# Patient Record
Sex: Male | Born: 1965 | Race: White | Hispanic: No | Marital: Married
Health system: Southern US, Community
[De-identification: ages and names within clinical notes are randomized; demographics above are authoritative.]

---

## 2011-05-30 ENCOUNTER — Observation Stay: Payer: Self-pay | Admitting: Surgery

## 2011-06-04 ENCOUNTER — Ambulatory Visit: Payer: Self-pay | Admitting: Unknown Physician Specialty

## 2011-08-30 ENCOUNTER — Ambulatory Visit: Payer: Self-pay | Admitting: Unknown Physician Specialty

## 2014-08-07 LAB — CBC WITH DIFFERENTIAL/PLATELET
Basophil #: 0 10*3/uL (ref 0.0–0.1)
Basophil %: 0.2 %
EOS PCT: 3.3 %
Eosinophil #: 0.4 10*3/uL (ref 0.0–0.7)
HCT: 32.8 % — ABNORMAL LOW (ref 40.0–52.0)
HGB: 10.7 g/dL — AB (ref 13.0–18.0)
LYMPHS ABS: 1.6 10*3/uL (ref 1.0–3.6)
Lymphocyte %: 12.7 %
MCH: 29.5 pg (ref 26.0–34.0)
MCHC: 32.5 g/dL (ref 32.0–36.0)
MCV: 91 fL (ref 80–100)
Monocyte #: 1.2 x10 3/mm — ABNORMAL HIGH (ref 0.2–1.0)
Monocyte %: 9.6 %
NEUTROS ABS: 9.4 10*3/uL — AB (ref 1.4–6.5)
Neutrophil %: 74.2 %
Platelet: 391 10*3/uL (ref 150–440)
RBC: 3.61 10*6/uL — ABNORMAL LOW (ref 4.40–5.90)
RDW: 13.5 % (ref 11.5–14.5)
WBC: 12.7 10*3/uL — ABNORMAL HIGH (ref 3.8–10.6)

## 2014-08-07 LAB — COMPREHENSIVE METABOLIC PANEL
ALK PHOS: 69 U/L
ALT: 22 U/L
ANION GAP: 7 (ref 7–16)
AST: 22 U/L (ref 15–37)
Albumin: 2.4 g/dL — ABNORMAL LOW (ref 3.4–5.0)
BILIRUBIN TOTAL: 0.3 mg/dL (ref 0.2–1.0)
BUN: 8 mg/dL (ref 7–18)
CALCIUM: 8 mg/dL — AB (ref 8.5–10.1)
CO2: 28 mmol/L (ref 21–32)
Chloride: 103 mmol/L (ref 98–107)
Creatinine: 1.05 mg/dL (ref 0.60–1.30)
EGFR (African American): >60
EGFR (Non-African Amer.): >60
Glucose: 89 mg/dL (ref 65–99)
OSMOLALITY: 273 (ref 275–301)
POTASSIUM: 3.9 mmol/L (ref 3.5–5.1)
Sodium: 138 mmol/L (ref 136–145)
Total Protein: 6.4 g/dL (ref 6.4–8.2)

## 2014-08-07 LAB — TROPONIN I: Troponin-I: <0.02 ng/mL

## 2014-08-07 LAB — URINALYSIS, COMPLETE
BLOOD: NEGATIVE
Bacteria: NONE SEEN
Bilirubin,UR: NEGATIVE
Glucose,UR: NEGATIVE mg/dL (ref 0–75)
Leukocyte Esterase: NEGATIVE
Nitrite: NEGATIVE
Ph: 5 (ref 4.5–8.0)
RBC,UR: 3 /HPF (ref 0–5)
SPECIFIC GRAVITY: 1.03 (ref 1.003–1.030)
WBC UR: 4 /HPF (ref 0–5)

## 2014-08-07 LAB — LIPASE, BLOOD: Lipase: 987 U/L — ABNORMAL HIGH (ref 73–393)

## 2014-08-08 ENCOUNTER — Inpatient Hospital Stay: Payer: Self-pay | Admitting: Internal Medicine

## 2014-08-09 LAB — BASIC METABOLIC PANEL
ANION GAP: 3 — AB (ref 7–16)
BUN: 8 mg/dL (ref 7–18)
CALCIUM: 7.5 mg/dL — AB (ref 8.5–10.1)
CO2: 30 mmol/L (ref 21–32)
CREATININE: 0.88 mg/dL (ref 0.60–1.30)
Chloride: 109 mmol/L — ABNORMAL HIGH (ref 98–107)
EGFR (African American): >60
EGFR (Non-African Amer.): >60
Glucose: 157 mg/dL — ABNORMAL HIGH (ref 65–99)
Osmolality: 285 (ref 275–301)
POTASSIUM: 4 mmol/L (ref 3.5–5.1)
Sodium: 142 mmol/L (ref 136–145)

## 2014-08-09 LAB — CBC WITH DIFFERENTIAL/PLATELET
Basophil #: 0 10*3/uL (ref 0.0–0.1)
Basophil %: 0.2 %
EOS PCT: 0.1 %
Eosinophil #: 0 10*3/uL (ref 0.0–0.7)
HCT: 28.3 % — AB (ref 40.0–52.0)
HGB: 9.2 g/dL — ABNORMAL LOW (ref 13.0–18.0)
LYMPHS ABS: 0.6 10*3/uL — AB (ref 1.0–3.6)
LYMPHS PCT: 9.1 %
MCH: 29.6 pg (ref 26.0–34.0)
MCHC: 32.6 g/dL (ref 32.0–36.0)
MCV: 91 fL (ref 80–100)
MONO ABS: 0.4 x10 3/mm (ref 0.2–1.0)
Monocyte %: 6.9 %
NEUTROS ABS: 5.3 10*3/uL (ref 1.4–6.5)
NEUTROS PCT: 83.7 %
Platelet: 350 10*3/uL (ref 150–440)
RBC: 3.11 10*6/uL — ABNORMAL LOW (ref 4.40–5.90)
RDW: 13.4 % (ref 11.5–14.5)
WBC: 6.4 10*3/uL (ref 3.8–10.6)

## 2014-08-10 LAB — LIPASE, BLOOD: LIPASE: 194 U/L (ref 73–393)

## 2014-08-10 LAB — CBC WITH DIFFERENTIAL/PLATELET
Basophil #: 0 10*3/uL (ref 0.0–0.1)
Basophil %: 0.2 %
EOS PCT: 0.1 %
Eosinophil #: 0 10*3/uL (ref 0.0–0.7)
HCT: 25.9 % — ABNORMAL LOW (ref 40.0–52.0)
HGB: 8.5 g/dL — ABNORMAL LOW (ref 13.0–18.0)
LYMPHS PCT: 10.6 %
Lymphocyte #: 0.9 10*3/uL — ABNORMAL LOW (ref 1.0–3.6)
MCH: 29.6 pg (ref 26.0–34.0)
MCHC: 32.9 g/dL (ref 32.0–36.0)
MCV: 90 fL (ref 80–100)
MONOS PCT: 11.8 %
Monocyte #: 1.1 x10 3/mm — ABNORMAL HIGH (ref 0.2–1.0)
NEUTROS ABS: 6.9 10*3/uL — AB (ref 1.4–6.5)
NEUTROS PCT: 77.3 %
Platelet: 403 10*3/uL (ref 150–440)
RBC: 2.88 10*6/uL — ABNORMAL LOW (ref 4.40–5.90)
RDW: 13.9 % (ref 11.5–14.5)
WBC: 8.9 10*3/uL (ref 3.8–10.6)

## 2014-08-10 LAB — BASIC METABOLIC PANEL
Anion Gap: 5 — ABNORMAL LOW (ref 7–16)
BUN: 12 mg/dL (ref 7–18)
CO2: 28 mmol/L (ref 21–32)
Calcium, Total: 7.4 mg/dL — ABNORMAL LOW (ref 8.5–10.1)
Chloride: 110 mmol/L — ABNORMAL HIGH (ref 98–107)
Creatinine: 0.83 mg/dL (ref 0.60–1.30)
EGFR (Non-African Amer.): >60
Glucose: 133 mg/dL — ABNORMAL HIGH (ref 65–99)
Osmolality: 287 (ref 275–301)
Potassium: 4.5 mmol/L (ref 3.5–5.1)
SODIUM: 143 mmol/L (ref 136–145)

## 2014-08-10 LAB — CLOSTRIDIUM DIFFICILE(ARMC)

## 2014-08-10 LAB — HEMOGLOBIN: HGB: 9.1 g/dL — AB (ref 13.0–18.0)

## 2014-08-10 LAB — SEDIMENTATION RATE: Erythrocyte Sed Rate: 65 mm/hr — ABNORMAL HIGH (ref 0–15)

## 2014-08-11 LAB — HEMOGLOBIN: HGB: 7.8 g/dL — AB (ref 13.0–18.0)

## 2014-08-12 LAB — CULTURE, BLOOD (SINGLE)

## 2014-08-12 LAB — WBCS, STOOL

## 2014-08-12 LAB — HEMOGLOBIN: HGB: 9 g/dL — AB (ref 13.0–18.0)

## 2014-08-13 LAB — BASIC METABOLIC PANEL
Anion Gap: 8 (ref 7–16)
BUN: 12 mg/dL (ref 7–18)
CREATININE: 1.1 mg/dL (ref 0.60–1.30)
Calcium, Total: 7.9 mg/dL — ABNORMAL LOW (ref 8.5–10.1)
Chloride: 105 mmol/L (ref 98–107)
Co2: 29 mmol/L (ref 21–32)
Glucose: 167 mg/dL — ABNORMAL HIGH (ref 65–99)
Osmolality: 287 (ref 275–301)
Potassium: 4.1 mmol/L (ref 3.5–5.1)
SODIUM: 142 mmol/L (ref 136–145)

## 2014-08-13 LAB — HEMOGLOBIN: HGB: 9.4 g/dL — AB (ref 13.0–18.0)

## 2014-08-13 LAB — CLOSTRIDIUM DIFFICILE(ARMC)

## 2014-08-16 LAB — HEMOGLOBIN: HGB: 9.5 g/dL — ABNORMAL LOW (ref 13.0–18.0)

## 2014-08-17 LAB — CBC WITH DIFFERENTIAL/PLATELET
BASOS PCT: 0.1 %
Basophil #: 0 10*3/uL (ref 0.0–0.1)
Eosinophil #: 0 10*3/uL (ref 0.0–0.7)
Eosinophil %: 0 %
HCT: 29.7 % — AB (ref 40.0–52.0)
HGB: 9.4 g/dL — ABNORMAL LOW (ref 13.0–18.0)
Lymphocyte #: 1.4 10*3/uL (ref 1.0–3.6)
Lymphocyte %: 9.4 %
MCH: 28.8 pg (ref 26.0–34.0)
MCHC: 31.5 g/dL — ABNORMAL LOW (ref 32.0–36.0)
MCV: 92 fL (ref 80–100)
MONOS PCT: 7.5 %
Monocyte #: 1.2 x10 3/mm — ABNORMAL HIGH (ref 0.2–1.0)
NEUTROS ABS: 12.6 10*3/uL — AB (ref 1.4–6.5)
Neutrophil %: 83 %
PLATELETS: 418 10*3/uL (ref 150–440)
RBC: 3.25 10*6/uL — AB (ref 4.40–5.90)
RDW: 14.4 % (ref 11.5–14.5)
WBC: 15.2 10*3/uL — AB (ref 3.8–10.6)

## 2014-08-17 LAB — BASIC METABOLIC PANEL
Anion Gap: 7 (ref 7–16)
BUN: 14 mg/dL (ref 7–18)
CHLORIDE: 104 mmol/L (ref 98–107)
CO2: 29 mmol/L (ref 21–32)
Calcium, Total: 8.4 mg/dL — ABNORMAL LOW (ref 8.5–10.1)
Creatinine: 0.8 mg/dL (ref 0.60–1.30)
EGFR (African American): >60
EGFR (Non-African Amer.): >60
GLUCOSE: 133 mg/dL — AB (ref 65–99)
OSMOLALITY: 282 (ref 275–301)
Potassium: 4.3 mmol/L (ref 3.5–5.1)
SODIUM: 140 mmol/L (ref 136–145)

## 2014-08-18 LAB — BASIC METABOLIC PANEL
Anion Gap: 6 — ABNORMAL LOW (ref 7–16)
BUN: 17 mg/dL (ref 7–18)
Calcium, Total: 8.4 mg/dL — ABNORMAL LOW (ref 8.5–10.1)
Chloride: 104 mmol/L (ref 98–107)
Co2: 30 mmol/L (ref 21–32)
Creatinine: 0.86 mg/dL (ref 0.60–1.30)
EGFR (African American): >60
Glucose: 116 mg/dL — ABNORMAL HIGH (ref 65–99)
Osmolality: 282 (ref 275–301)
Potassium: 4.4 mmol/L (ref 3.5–5.1)
Sodium: 140 mmol/L (ref 136–145)

## 2014-08-18 LAB — CBC WITH DIFFERENTIAL/PLATELET
Basophil #: 0 10*3/uL (ref 0.0–0.1)
Basophil %: 0.1 %
Eosinophil #: 0 10*3/uL (ref 0.0–0.7)
Eosinophil %: 0.1 %
HCT: 34.1 % — ABNORMAL LOW (ref 40.0–52.0)
HGB: 10.6 g/dL — AB (ref 13.0–18.0)
Lymphocyte #: 2.7 10*3/uL (ref 1.0–3.6)
Lymphocyte %: 18.7 %
MCH: 28.4 pg (ref 26.0–34.0)
MCHC: 31.1 g/dL — ABNORMAL LOW (ref 32.0–36.0)
MCV: 92 fL (ref 80–100)
MONOS PCT: 8.4 %
Monocyte #: 1.2 x10 3/mm — ABNORMAL HIGH (ref 0.2–1.0)
NEUTROS ABS: 10.5 10*3/uL — AB (ref 1.4–6.5)
NEUTROS PCT: 72.7 %
PLATELETS: 530 10*3/uL — AB (ref 150–440)
RBC: 3.73 10*6/uL — ABNORMAL LOW (ref 4.40–5.90)
RDW: 14.7 % — ABNORMAL HIGH (ref 11.5–14.5)
WBC: 14.5 10*3/uL — AB (ref 3.8–10.6)

## 2015-02-15 NOTE — Discharge Summary (Signed)
PATIENT NAME:  Jake Chavez, Jake Chavez MR#:  [id number] DATE OF BIRTH:  12/02/1965  DATE OF ADMISSION:  08/08/2014 DATE OF DISCHARGE:  08/18/2014  DISCHARGE DIAGNOSES:  1.  Crohn disease exacerbation in large intestine.  2.  Acute gastrointestinal bleeding due to Crohn disease exacerbation.  3.  Acute posthemorrhagic anemia.  4.  Chronic sinusitis.  5.  Hyperlipidemia.   CONSULTATIONS:  1.  Manya Silvas, MD, gastroenterology.   2.  Arther Dames, MD, gastroenterology.   PROCEDURES:  1.  Colonoscopy performed 08/13/2014 by Dr. Vira Agar, which showed inflammation with altered vascularity, friability, granularity, which was continuous and circumferential from the anus to the descending colon. Severely inflamed. Also internal hemorrhoids grade 1.  2.  CT scan of the abdomen and pelvis with contrast October 14 showed diffuse wall thickening in the transverse colon and entirety of the descending colon to the level of proximal sigmoid colon.   HISTORY OF PRESENT ILLNESS: This very pleasant 49 year old male diagnosed with Crohn disease in 2012 presents to the Emergency Room with fever. Initial diagnosis of Crohn disease was made after developing a perirectal abscess. He has been on mesalamine and has been having 2 to 3 months of diarrhea treated with mesalamine enemas and steroid enemas. On Emergency Room evaluation he is found to have diffuse wall thickening of the transverse and descending colon. At that time he also admitted to having had multiple bloody stools.   HOSPITAL COURSE BY PROBLEM:  1.  Acute exacerbation of Crohn disease: The patient was followed by gastroenterology, Dr. Vira Agar, throughout his hospitalization. Colonoscopy confirmed diffuse inflammation consistent with Crohn exacerbation. He was treated while inpatient with 10 days of ciprofloxacin and metronidazole. He was initially treated with Solu-Medrol and after colonoscopy started on Remicade on October 21. He is discharged on a  prednisone taper and follow up with Dr. Vira Agar 1 week after discharge on October 28.  2.  Acute gastrointestinal bleed with posthemorrhagic anemia due to Crohn disease:  At the time of discharge, hemoglobin is 10.6 and has remained fairly stable throughout the hospitalization, presenting hemoglobin was 10.7, lowest hemoglobin was 7.8 on October 18 and at that time he was transfused 1 unit of packed red blood cells.   DISCHARGE PHYSICAL EXAMINATION:  VITAL SIGNS: Temperature 97.4, pulse 66, respirations 18, blood pressure 132/86, oxygenation 96% on room air.  GENERAL: No acute distress.  PULMONARY: Lungs clear to auscultation bilaterally with good air movement.  CARDIOVASCULAR: Regular rate and rhythm, no murmurs, rubs, or gallops, no peripheral edema, peripheral pulses are 2+.  ABDOMEN: Abdomen soft, nontender, nondistended, bowel sounds are normal.   LABORATORY DATA: Sodium 140, potassium 4.4, chloride 104, bicarb 30, BUN 17, creatinine 0.86, glucose 116, hemoglobin 10.6, white blood cells 14.5, platelets 530, MCV 92.   DISCHARGE MEDICATIONS:  1.  Simvastatin 40 mg once a day.  2.  Mesalamine 4 grams/60 mL rectal kit 60 mL rectally once a day at bedtime.  3.  Multivitamin 1 tablet once a day.  4.  Cortisone 10% rectal foam one application once a day.  5.  Balsalazide 750 mg oral capsule, 2 capsules twice a day.  6.  Vitamin C 500 mg 1 tablet once a day.  7.  Adderall XR 15 mg 1 capsule once a day in the morning.  8.  Bupropion 24 hour extended release 1 tablet 300 mg every 24 hours.  9.  Docusate sodium 1 capsule once a day.  10.  Acetaminophen 500 mg 2 tablets every 6  hours as needed for pain.  11.  Iron 15 mg 1 tablet 3 times a day.  12.  Prednisone 40 mg once a day, decrease by 10 mg every 7 days.   DISPOSITION: Discharge to home.   CONDITION ON DISCHARGE: Stable.   DISCHARGE INSTRUCTIONS:  1.  Follow up with Dr. Vira Agar on October 28.  2.  Follow up with Dr. Hall Busing within the next  week.  3.  Diet: Mechanical soft diet, low sodium, low cholesterol.  4.  Activity: Advance as tolerated.   TIME SPENT ON DISCHARGE: 40 minutes.     ____________________________ Earleen Newport. Volanda Napoleon, MD cpw:AT D: 08/20/2014 15:44:08 ET T: 08/21/2014 04:12:06 ET JOB#: [id number]  cc: Barnetta Chapel P. Volanda Napoleon, MD, <Dictator> Aldean Jewett MD ELECTRONICALLY SIGNED 08/21/2014 15:20

## 2015-02-15 NOTE — Consult Note (Signed)
Pt with severe pan colitis from anus to 60cm, biopsies done.  Will increase solumedrol and talk to him about starting Humira or Remicade for better  control of his colitis.  Electronic Signatures: Scot JunElliott, Carlin Attridge T (MD)  (Signed on 20-Oct-15 15:00)  Authored  Last Updated: 20-Oct-15 15:00 by Scot JunElliott, Teniya Filter T (MD)

## 2015-02-15 NOTE — Consult Note (Signed)
Pt seen and examined. Full consult to follow. Pt with hx of Crohn's colitis, initially diagnosed in 2012 when he presented with perirectal abscess that had to be drained. After full colonoscopy by Dr. Mechele CollinElliott, patient was initially placed on asacol. Due to insurance and cost, was switched to balsalaizide 2 years ago. Had been doing relatively well until 1-2 months ago, when he started having worsening abdominal pain, rectal bleeding, and diarrhea/constipation. Saw Dr. Mechele CollinElliott 1 1/2 week ago, who added cortenema/protofoam daily. Went to work Saturday, developed high fevers with respiratory symptoms. Saw primary on Sunday placed on augmentin. No real relief. Called back Dr. Earnest ConroyElliott[place]. Advised to come to ER for further evaluation. CT showed diffuse left sided colitis, more extensive than before to distal transverse colon. No obvious abscess this time. Admitted and placed on IV Abx and IV steroids. Already starting to see improvement. Only mild tenderness in LLQ area.  Crohn's exacerbation. Agree with current regimen. Since augmentin can cause c.diff, would check stool. Continue IV meds for few more days before switching to po Abx and prednisone. Will consider repeating colonscopy in few days if patient requires more hospitalization. Pt works at [employer] and could be exposed to various pathogens. May need to time off after discharge so that patient may not get sick again from pathogens at [employer]. Also, if patient requires long term prednisone taper or have to be started on immunosuppressants or biologic agents in the future, may need to consider switching to another dept at [employer]. Will follow. Thanks.   Electronic Signatures: Lutricia Feilh, Wilhemina Grall (MD) (Signed on 16-Oct-15 14:17)  Authored   Last Updated: 16-Oct-15 14:18 by Lutricia Feilh, Kery Haltiwanger (MD)

## 2015-02-15 NOTE — Consult Note (Signed)
Nurse called that patient is too nauseated to drink the Golytely and will switch to Miralax with Gatorade.  Electronic Signatures: Scot JunElliott, Robert T (MD)  (Signed on 19-Oct-15 17:41)  Authored  Last Updated: 19-Oct-15 17:41 by Scot JunElliott, Robert T (MD)

## 2015-02-15 NOTE — Consult Note (Signed)
Chief Complaint:  Subjective/Chief Complaint Clinically improving. Some bleeding. Hgb drifting downwards. ESR high.   VITAL SIGNS/ANCILLARY NOTES: **Vital Signs.:   17-Oct-15 07:42  Vital Signs Type Q 8hr  Temperature Temperature (F) 97.2  Celsius 36.2  Temperature Source oral  Pulse Pulse 59  Respirations Respirations 18  Systolic BP Systolic BP 103  Diastolic BP (mmHg) Diastolic BP (mmHg) 79  Mean BP 94  Pulse Ox % Pulse Ox % 96  Pulse Ox Activity Level  At rest  Oxygen Delivery Room Air/ 21 %   Brief Assessment:  GEN no acute distress   Cardiac Regular   Respiratory clear BS   Gastrointestinal mild LLQ tenderness   Lab Results: Routine Chem:  17-Oct-15 04:15   Glucose, Serum  133  BUN 12  Creatinine (comp) 0.83  Sodium, Serum 143  Potassium, Serum 4.5  Chloride, Serum  110  CO2, Serum 28  Calcium (Total), Serum  7.4  Anion Gap  5  Osmolality (calc) 287  eGFR (African American) >60  eGFR (Non-African American) >60 (eGFR values <64m/min/1.73 m2 may be an indication of chronic kidney disease (CKD). Calculated eGFR, using the MRDR Study equation, is useful in  patients with stable renal function. The eGFR calculation will not be reliable in acutely ill patients when serum creatinine is changing rapidly. It is not useful in patients on dialysis. The eGFR calculation may not be applicable to patients at the low and high extremes of body sizes, pregnant women, and vetetarians.)  Lipase 194 (Result(s) reported on 10 Aug 2014 at 05:06AM.)  Routine Hem:  17-Oct-15 04:15   Erythrocyte Sed Rate  65 (Result(s) reported on 10 Aug 2014 at 05:29AM.)  WBC (CBC) 8.9  RBC (CBC)  2.88  Hemoglobin (CBC)  8.5  Hematocrit (CBC)  25.9  Platelet Count (CBC) 403  MCV 90  MCH 29.6  MCHC 32.9  RDW 13.9  Neutrophil % 77.3  Lymphocyte % 10.6  Monocyte % 11.8  Eosinophil % 0.1  Basophil % 0.2  Neutrophil #  6.9  Lymphocyte #  0.9  Monocyte #  1.1  Eosinophil # 0.0   Basophil # 0.0 (Result(s) reported on 10 Aug 2014 at 04:55AM.)   Assessment/Plan:  Assessment/Plan:  Assessment Crohn's flare. No stool last 24 hrs. High ESR. CRP pending.   Plan Moniter hgb. If hgb continues to fall, then hold discharge and plan colnoscopy on Monday. If hgb stable, ok for discharge on oral prednisone plus mesalamine, but make sure patient sees Dr. EVira Agarsoon. Thanks.   Electronic Signatures: OVerdie Shire(MD)  (Signed 17-Oct-15 11:13)  Authored: Chief Complaint, VITAL SIGNS/ANCILLARY NOTES, Brief Assessment, Lab Results, Assessment/Plan   Last Updated: 17-Oct-15 11:13 by OVerdie Shire(MD)

## 2015-02-15 NOTE — Consult Note (Signed)
Details:   - GI Note:  Feels better today.  Several loose small amt stool overngiht. Wants to go home. Abd pain improved.  No f/c.   Exam: nad, a and ox 4 cta, no w/c rrr, no m/r/g nt,nd,nabs  A/P: 1.)Colitis flare - seems to be improving nicely on remicaid, IV steroids, abx -  cont IV steroids, abx for now - if min stool overnight, ok for d/c tomorrow with GI clinic f/u - should be on pred taper (40 x7, 30x7,20x7,10x7)   Electronic Signatures: Dow Adolphein, Matthew (MD)  (Signed 24-Oct-15 19:19)  Authored: Details   Last Updated: 24-Oct-15 19:19 by Dow Adolphein, Matthew (MD)

## 2015-02-15 NOTE — H&P (Signed)
PATIENT NAME:  Jake Chavez, Jake Chavez MR#:  [id number] DATE OF BIRTH:  03-20-1966  DATE OF ADMISSION:  08/08/2014  PRIMARY CARE PHYSICIAN:  None.  PRIMARY GASTROENTEROLOGIST:  Lynnae Prude, MD   HISTORY OF PRESENT ILLNESS:  Jake Chavez is a 49 year old pleasant white male who was diagnosed with Crohn disease in 2012, when the patient was initially found to have perirectal abscess. Since then, the patient has been on mesalamine. For the last 2 to 3 months, he has been having multiple episodes of diarrhea. Concerning this, the patient was seen by Dr. Mechele Collin, who gave him mesalamine enemas and steroid enemas. However, the patient did not have much improvement. Since this morning, he started to have a fever. About a week back, the patient also started having some cough. The patient went to primary care physician and was given antibiotics without much improvement. As the patient continued to have the fever today, he called back to Dr. Mechele Collin, who recommended the patient to go to the Emergency Department.   WORKUP IN THE EMERGENCY DEPARTMENT:  CT of the abdomen and pelvis showed diffuse wall thickening involving the distal aspect of the transverse colon and entirety of the descending colon to the level of the proximal sigmoid colon with mesenteric stranding. He was also found to have retroperitoneal  pelvic lymph nodes, presumably reactive in etiology. The patient has been experiencing nausea. The patient has been having bloody stools.   PAST MEDICAL HISTORY: 1.  Hyperlipidemia.  2.  Crohn disease.   ALLERGIES:  SULFA DRUGS.   HOME MEDICATIONS: 1.  Vitamin C 500 mg once a day.  2.  Vicodin 5/500 mg every 4 hours as needed.  3.  Triamcinolone topical as needed.  4.  Simvastatin 40 mg once a day.  5.  Prednisone 40 mg once a day.  6.  Multi-minerals once a day.  7.  Milk of Magnesia as needed.  8.  Mesalamine rectal suppository.  9.  Keflex 500 mg 4 times a day.  10.  Cortifoam rectal once a day.   11.  Bupropion 24 orally once a day.  12.  Balsalazide 750 mg 2 capsules 2 times a day.  13.  Amphetamine 15 mg capsules once a day.  14.  Adderall 20 mg once a day.   SOCIAL HISTORY:  No history of smoking, drinking alcohol, or using illicit drugs. Works for [employer]. Married, lives with his wife and son.   FAMILY HISTORY:  Father was diagnosed with Crohn disease when he was a 50 year old.   REVIEW OF SYSTEMS: CONSTITUTIONAL:  Experiencing generalized weakness.  EYES:  No change in vision.  EARS, NOSE, AND THROAT:  No change in hearing.  RESPIRATORY:  No cough or shortness of breath.  CARDIOVASCULAR:  No chest pain or palpations.  GASTROINTESTINAL:  Has diarrhea and nausea.  GENITOURINARY:  No dysuria or hematuria.  HEMATOLOGIC:  No easy bruising or bleeding.  SKIN:  No rash or lesions.  MUSCULOSKELETAL:  No joint pains and aches.  NEUROLOGIC:  No weakness or numbness in any part of the body.  PHYSICAL EXAMINATION: GENERAL:  This is a well-built, well-nourished, age-appropriate male lying down in the bed, not in distress.  VITAL SIGNS:  Temperature 98.3, pulse 92, blood pressure 128/74, respiratory rate 16, oxygen saturation is 96% on room air.  HEENT:  Head is normocephalic and atraumatic. There is no scleral icterus. Conjunctivae are normal. Pupils are equal and react to light. Mucous membranes are moist. No pharyngeal erythema.  NECK:  Supple. No lymphadenopathy. No JVD. No carotid bruits.  CHEST:  Has no focal tenderness.  LUNGS:  Bilaterally clear to auscultation.  HEART:  S1, S2 regular. No murmurs are heard.  ABDOMEN:  Bowel sounds present. Soft. Has tenderness on the left side of the abdomen. No rebound or guarding. No hepatosplenomegaly.  EXTREMITIES:  No pedal edema. Pulses 2+.  NEUROLOGIC:  The patient is alert and oriented to place, person, and time. Cranial nerves II through XII are intact. Motor is 5/5 in upper and lower extremities.   LABORATORY DATA:  CMP is  completely within normal limits. Lipase is 987. CBC: WBC of 12.7, hemoglobin 10.7, the rest of all the values are within normal limits. Lipase is 97.   ASSESSMENT AND PLAN:  Jake Chavez is a 49 year old male who comes with Crohn exacerbation.   1.  Colitis secondary to Crohn exacerbation. Admit the patient to the medical bed. Keep the patient n.p.o. Continue with intravenous fluids. Keep the patient on Cipro and Flagyl, concerning about the patient's fever. Also, keep the patient on Solu-Medrol. Consult Dr. Mechele CollinElliott.  2.  Acute pancreatitis. Continue with intravenous fluids and follow up.  3.  Acute bronchitis. Does not seem to have any issues at this time.  4.  Hyperlipidemia. Continue statin.  5.  Keep the patient on deep vein thrombosis prophylaxis with sequential compression devices.   TIME SPENT:  50 minutes.    ____________________________ Susa GriffinsPadmaja Callahan Wild, MD pv:nb D: 08/08/2014 02:31:54 ET T: 08/08/2014 03:38:21 ET JOB#: [id number]  cc: Susa GriffinsPadmaja Drayven Marchena, MD, <Dictator> Susa GriffinsPADMAJA Decklyn Hornik MD ELECTRONICALLY SIGNED 08/08/2014 21:08

## 2015-02-15 NOTE — Consult Note (Signed)
Pt with bleeding, elevated WBC, better now on few days of treatment, not bleeding now, stool become mushy and pain is less.  Will do colonoscopy tomorrow with Golytely prep this evening.  Chest clear, VSS.  Electronic Signatures: Scot JunElliott, Cynthis Purington T (MD)  (Signed on 19-Oct-15 09:14)  Authored  Last Updated: 19-Oct-15 09:14 by Scot JunElliott, Shavontae Gibeault T (MD)

## 2015-02-15 NOTE — Consult Note (Signed)
Pt got Remicade infusion and feels like his abd has "cooled down".  No harmful side effects from the infusion.  Had 2 loose stools during the night, a few today. PE shows non tender abd.  Will keep in hospital another day or two til see stools forming and nocturnal diarrhea lessen.  Will go home on oral prednisone and taper this as the Remicade kicks in.  Plan next infusion in 2 weeks.  Electronic Signatures: Scot JunElliott, Robert T (MD)  (Signed on 22-Oct-15 16:14)  Authored  Last Updated: 22-Oct-15 16:14 by Scot JunElliott, Robert T (MD)

## 2015-02-15 NOTE — Consult Note (Signed)
I usually use as criteria for discharge that the patient no longer gets up out of sleep with diarrhea.  He is getting up 2 times a nite which is better but not where I want it.  If this improves over weekend then he could be discharged on oral prednisone 60mg  a day and 3 days of Cipro and Flagyl and follow up with me in office next week on 10/28.  Dr. Shelle Ironein will be on call this weekend.  Electronic Signatures: Scot JunElliott, Ivery Michalski T (MD)  (Signed on 23-Oct-15 13:25)  Authored  Last Updated: 23-Oct-15 13:25 by Scot JunElliott, Wirt Hemmerich T (MD)

## 2015-02-15 NOTE — Consult Note (Signed)
Discussed with patient about Humira and Remicade and will check CXR and Hep B surface antigen and move pt to 1 C tomorrow for infusion therapy.  Surgical alternative discussed also.    Electronic Signatures: Scot JunElliott, Tnia Anglada T (MD)  (Signed on 20-Oct-15 18:51)  Authored  Last Updated: 20-Oct-15 18:51 by Scot JunElliott, Hiilei Gerst T (MD)

## 2015-02-15 NOTE — Consult Note (Signed)
Clinically doing well but continues to have rectal bleeding with drop in hgb. On IV Abx/steroids. Not ready for discharge as long as hgb continues to drop. Can switch to po prednisone/Abx. Discussed with patient about scheduling colonoscopy. Pt wants to wait until Dr. Mechele CollinElliott sees the patient and decide when he wants to schedule colonscopy. I will let Dr. Mechele CollinElliott tomorrow AM. Thanks.  Electronic Signatures: Lutricia Feilh, Vashti Bolanos (MD)  (Signed on 18-Oct-15 10:57)  Authored  Last Updated: 18-Oct-15 10:57 by Lutricia Feilh, Riven Beebe (MD)

## 2015-02-15 NOTE — Consult Note (Signed)
PATIENT NAME:  Janna ArchGREENERT, Inaki C MR#:  [id number] DATE OF BIRTH:  [birth year]  DATE OF CONSULTATION:  08/09/2014  CONSULTING PHYSICIAN:  Ezzard StandingPaul Y. Bluford Kaufmannh, MD  REASON FOR REFERRAL: Crohn disease.   DESCRIPTION: The patient is a 49 year old white male who was initially diagnosed with Crohn colitis back in 2012. He initially presented with perirectal abscess requiring drainage and antibiotics by surgery. Then, [facility] Then Dr. Mechele CollinElliott did a full colonoscopy which confirmed Crohn colitis in November 2012.  At the time the ileum looked normal, but there was severe inflammation of the proximal rectum, sigmoid colon and descending colon. The patient was initially placed on Asacol which worked well. Unfortunately, due to the insurance and the cost of Asacol he had to switch to balsalazide twice a day at least for the past couple of years. He was relatively asymptomatic until a few months ago when he started having increasing abdominal pain, mainly to the left side, associated with changes in bowel habits with diarrhea alternating with constipation as well as persistent rectal bleeding. Therefore, the patient saw Dr. Mechele CollinElliott 1.5 weeks ago in the office. Doctor Mechele CollinElliott added a combination of ProctoFoam and cold enemas on a daily basis. Unfortunately, he did not see a whole lot of improvement. He was still having significant urgency and nocturnal bowel movements and bleeding. He was off that week but went to work on Saturday. Unfortunately, he started developing rather high fevers with some respiratory symptoms with coughing that was productive. He then saw his primary doctor on Sunday and was placed on Augmentin.  Unfortunately, despite all these measures he did not see a whole lot of improvement. Finally, he called back to Dr. Earnest ConroyElliott's office this Wednesday. The nurse practitioner advised the patient to come to the Emergency Room for further evaluation.   CT scan was done and showed diffuse  left-sided colitis involving the distal transverse colon. No obvious rectal abscess was seen. The patient was then admitted and placed on intravenous antibiotics as well as IV steroids. I was asked to see the patient because of the overall symptoms. The patient is already starting to see some improvement. He has less diarrhea and less bleeding and less abdominal pain.   PAST MEDICAL HISTORY: Notable for Crohn disease and hyperlipidemia.   ALLERGIES: SULFA DRUGS.   MEDICATIONS AT HOME: Vicodin as needed, simvastatin 40 mg daily. He was actually started on prednisone recently at 40 mg daily. Mesalamine rectal suppository, BuSpar, balsalazide 750 mg 2 capsules twice a day, amphetamine and Adderall daily.   SOCIAL HISTORY: He denies tobacco and alcohol.   FAMILY HISTORY: Father with recent diagnosis of ulcerative colitis. This is different from what the H and P says about Crohn disease.   REVIEW OF SYSTEMS: There is some generalized weakness. There are fevers, there is no coughing at this point, no visual changes. There is some diarrhea and nausea, rectal bleeding and pain. The rest of the review of systems is negative.   PHYSICAL EXAMINATION:  GENERAL: The patient appears to be in no acute distress right now. He is afebrile. Vital signs are stable.  HEAD AND NECK: Within normal limits.  CARDIAC: Regular rhythm and rate without murmurs.  LUNGS: Clear bilaterally.  ABDOMEN: Normoactive bowel sounds, soft. There is some mild tenderness, more in the left lower quadrant area. There is no rebound or guarding. There is no hepatomegaly.  EXTREMITIES: No clubbing, cyanosis or edema.  SKIN: Normal.  NEUROLOGIC: Examination is nonfocal.  LABORATORY DATA: Today hemoglobin is 9.2, white count is 6.4; it was 12.7 on admission. Liver enzymes are normal. Electrolytes are normal. Lipase is a little high at 987, but I doubt that he has pancreatitis. Blood cultures so far are negative.   Urinalysis is negative.    EKG is negative.  CT shows diffuse thickening of the distal transverse colon, descending colon and proximal sigmoid colon. It appears to have increased in extent compared to the previous colonoscopy.  There is no abscess present.   IMPRESSION: This is a patient who appears to have Crohn colitis flare. He appears to be responding to IV steroids and IV antibiotics so far. I agree we should continue the IV medication for a few more days before switching  to oral medication. Because of use of Augmentin we need to make sure there are no signs of Clostridium difficile. The patient will likely need to have a repeat colonoscopy soon. If he requires further hospitalization for a few more days we can consider even doing this on Monday or we can do this as an outpatient if he responds quickly.   Apparently the patient works at [employer] and is exposed to various pathogens. He is concerned that it may be affecting his Crohn disease. So far there are no signs of other infections going on. Nevertheless, after his discharge he may need to have a little time off before he can go back to work. If the patient requires long-term prednisone taper or if he has to be started on immunosuppressants or biological agents in the future, the patient may need to consider switching to a different department at [facility] where he will not be exposed to so many different pathogens, especially if his immune system would be compromised with the medication he is on for his Crohn disease.   I will continue to follow the patient. Thank you for the referral. I will let Dr. Mechele Collin know come  Monday that the patient is in the hospital.    ____________________________ Ezzard Standing. Bluford Kaufmann, MD pyo:lt D: 08/09/2014 14:32:52 ET T: 08/09/2014 15:32:20 ET JOB#: [id number]  cc: Ezzard Standing. Bluford Kaufmann, MD, <Dictator> Ezzard Standing Jakyla Reza MD ELECTRONICALLY SIGNED 08/11/2014 9:13

## 2015-02-17 LAB — SURGICAL PATHOLOGY

## 2015-08-21 ENCOUNTER — Ambulatory Visit: Admit: 2015-08-21 | Payer: Self-pay | Admitting: Unknown Physician Specialty

## 2015-08-21 SURGERY — COLONOSCOPY WITH PROPOFOL
Anesthesia: General

## 2016-01-23 IMAGING — CT CT ABD-PELV W/ CM
2 of 5 series · 14 of 46 positions shown, 16 images · IV contrast (isovue)
Comparison: Pelvic MRI- 06/04/2011

CLINICAL DATA: Lower abdominal pain and rectal bleeding for several
months. History of Crohn's disease. Now with fever. Initial
encounter.

EXAM:
CT ABDOMEN AND PELVIS WITH CONTRAST
TECHNIQUE: Multidetector CT imaging of the abdomen and pelvis was performed
using the standard protocol following bolus administration of
intravenous contrast.
CONTRAST:  100 cc Isovue 370

[Series 2: routine abd pel with · axial · 0.94mm/px · z∈[-1167,-652]mm · 11 of 117 slices shown, 13 images]
[im 7/117  soft-tissue]
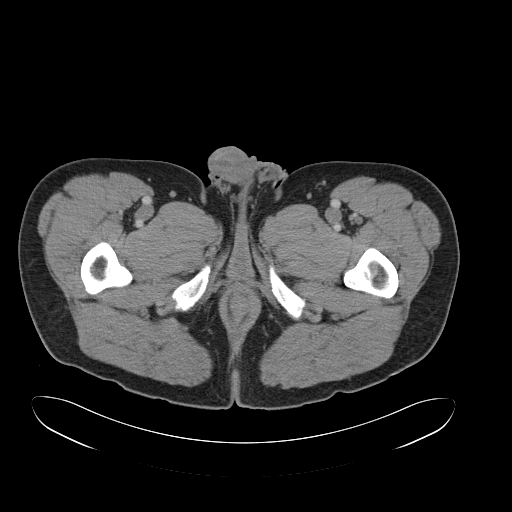
[im 7/117  bone]
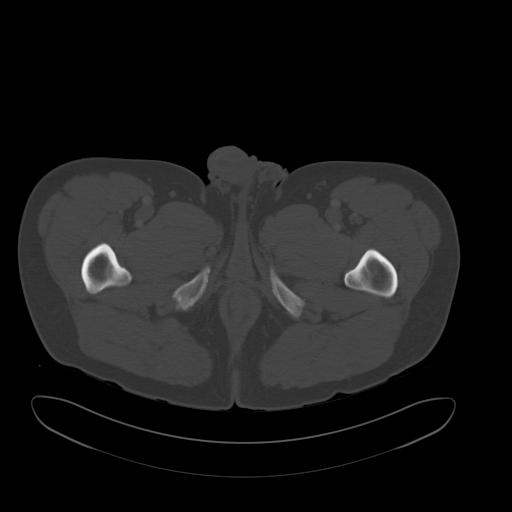
[im 20/117  soft-tissue]
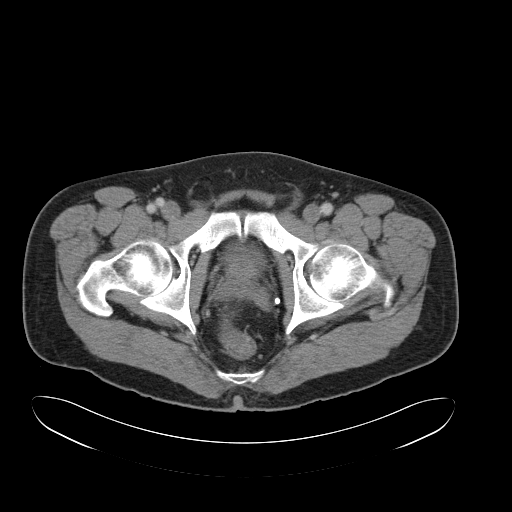
[im 26/117  soft-tissue]
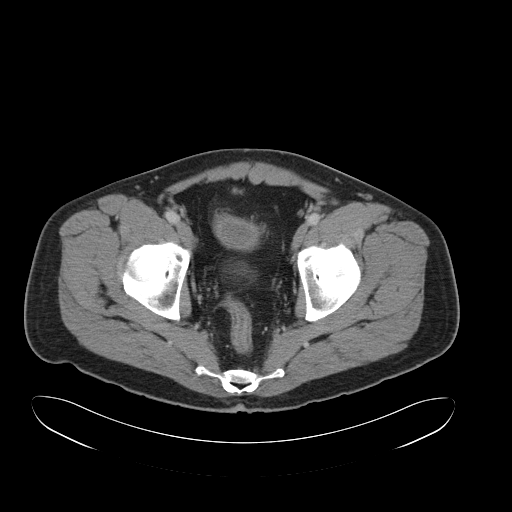
[im 39/117  soft-tissue]
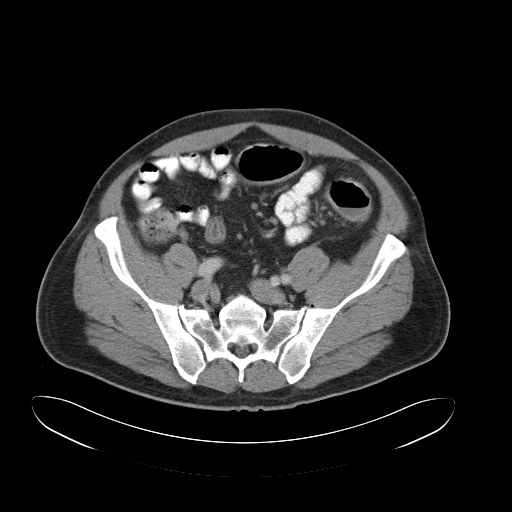
[im 46/117  soft-tissue]
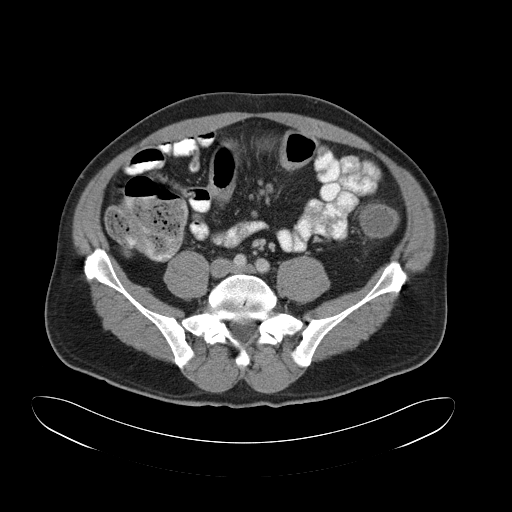
[im 59/117  soft-tissue]
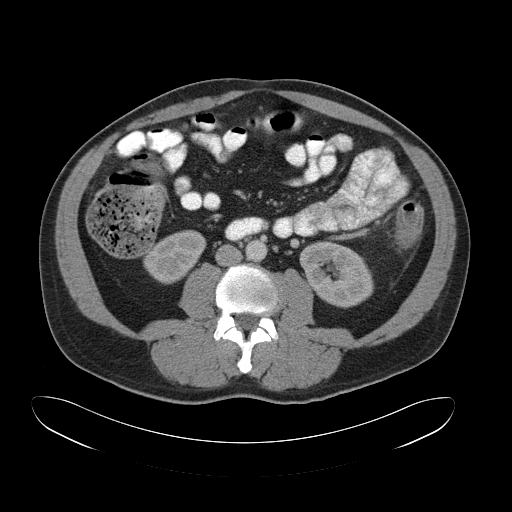
[im 71/117  soft-tissue]
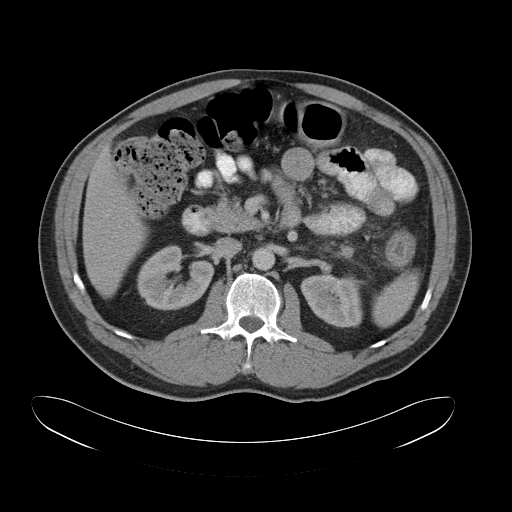
[im 78/117  soft-tissue]
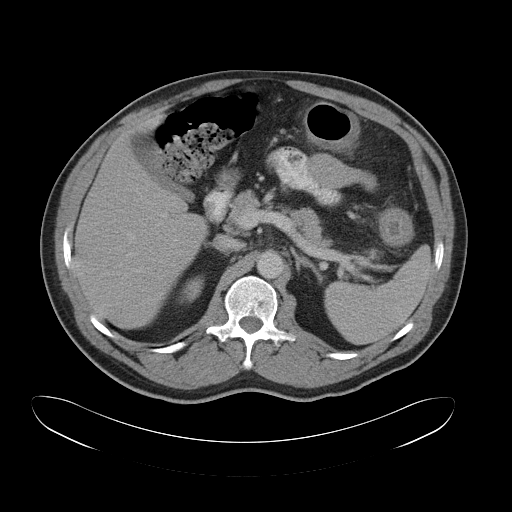
[im 91/117  soft-tissue]
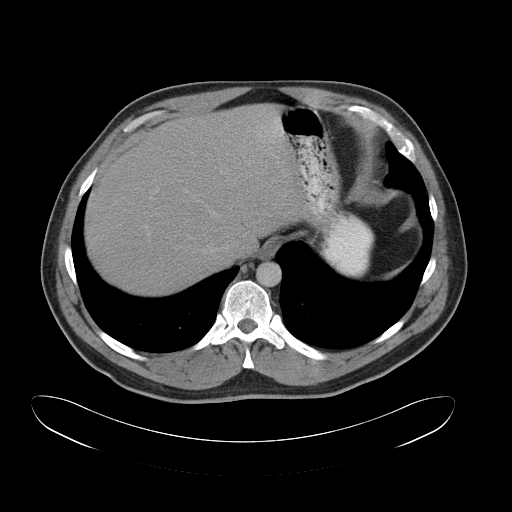
[im 91/117  bone]
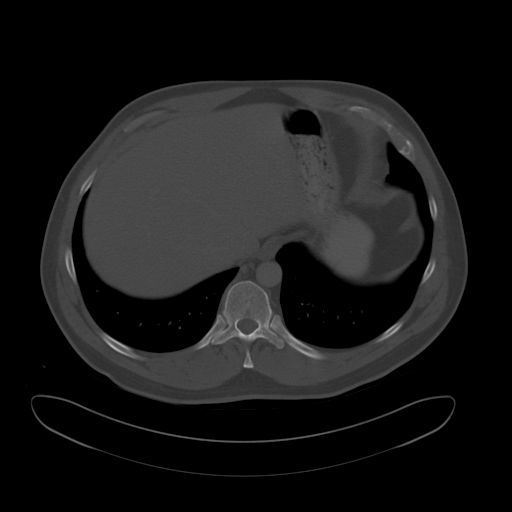
[im 97/117  soft-tissue]
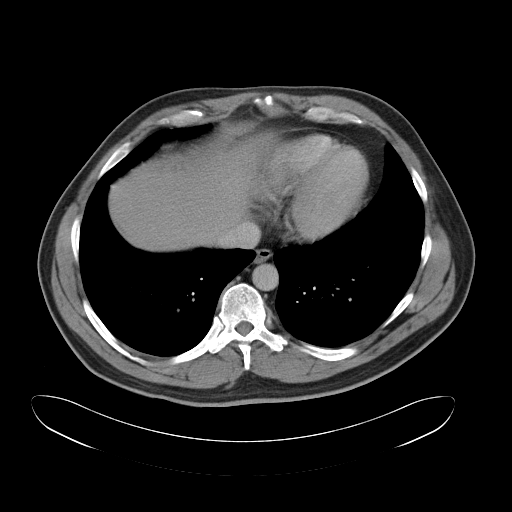
[im 110/117  soft-tissue]
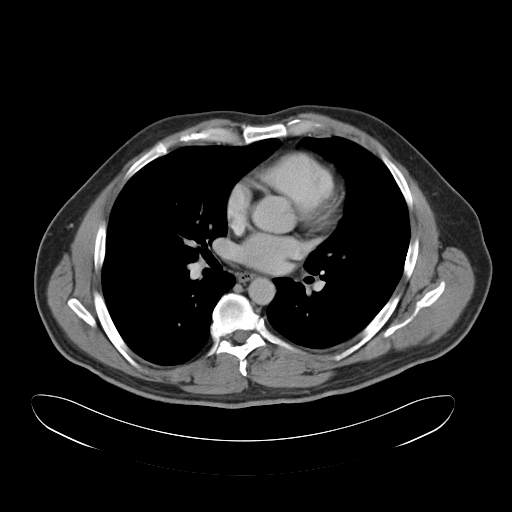

[Series 6: cor routine abd pel with · coronal · 0.86mm/px · 3 of 150 slices shown]
[im 50/150  soft-tissue]
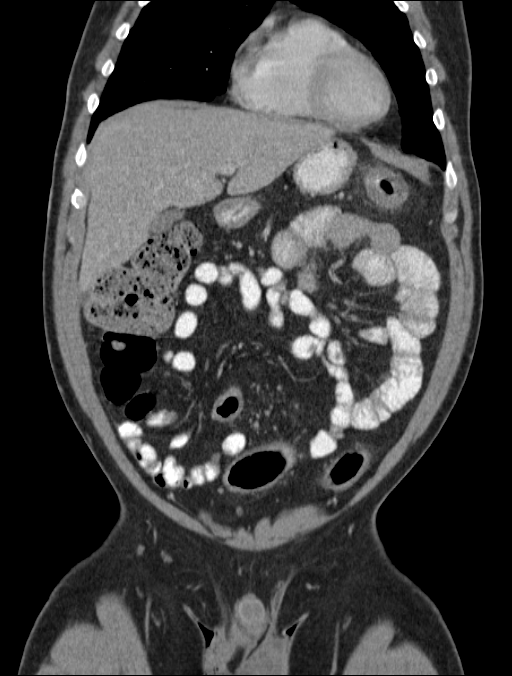
[im 67/150  soft-tissue]
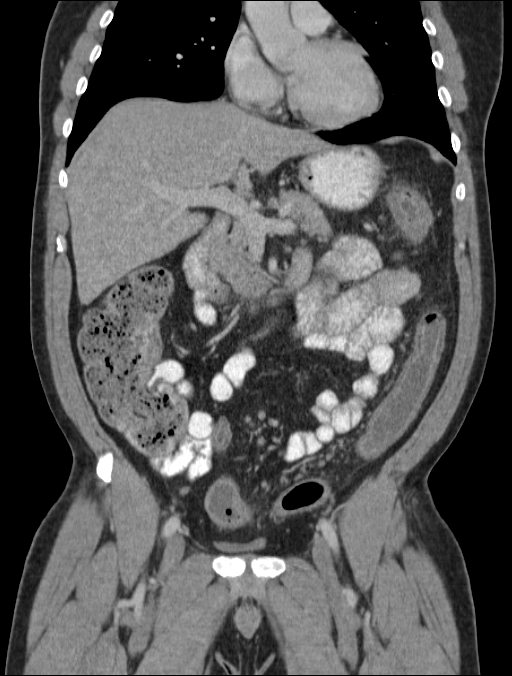
[im 83/150  soft-tissue]
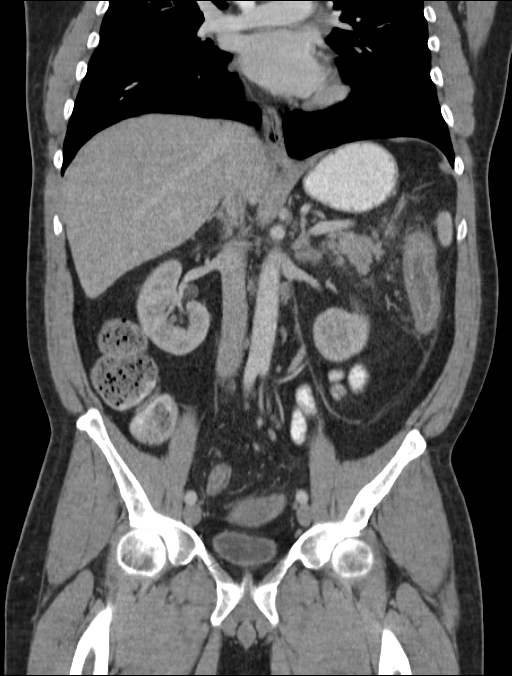

[14 of 46 positions shown; findings below may reference images not displayed]

FINDINGS: There is diffuse wall thickening involving the distal aspect of the
transverse colon and the entirety of the descending colon to the
level of the proximal sigmoid colon. The distal sigmoid colon and
rectum are underdistended though there is apparent mild wall
thickening at both of these locations as well.

There is a minimal amount of mesenteric stranding primarily about
the descending colon. No evidence of perforation or
definable/drainable fluid collection. No pneumoperitoneum
pneumatosis or portal venous gas.

Enteric contrast extensive the level of distal small bowel. Moderate
colonic stool burden within the normal-appearing cecum and ascending
colon. No evidence of enteric obstruction.

Normal appearance of the terminal ileum. Normal appearance of the
appendix.

Normal hepatic contour. No discrete hepatic lesions. The gallbladder
is under distended but otherwise normal appearance. No radiopaque
gallstones. No intra or extrahepatic biliary duct dilatation. No
ascites.

There is symmetric enhancement and excretion of the bilateral
kidneys. No definite renal stones on this postcontrast examination.
No discrete renal lesions. No urinary obstruction or perinephric
stranding. Normal appearance of the bilateral adrenal glands,
pancreas and spleen.

Scattered atherosclerotic plaque within a normal caliber abdominal
aorta. The major branch vessels of the abdominal aorta appear widely
patent on this non CTA examination. Incidental note is made of a
circumaortic left renal vein.

Scattered shotty retroperitoneal, mesenteric and pelvic lymph nodes
are numerous though individually not enlarged by size criteria with
index mesenteric lymph node measuring 0.8 cm in greatest short axis
diameter (image 76, series 2) and index left pelvic sidewall lymph
node measuring 0.5 cm (image 93).

Normal appearance of the pelvic organs. Normal appearance of the
urinary bladder given degree distention. No free fluid in the pelvic
cul-de-sac.

Limited visualization of the lower thorax is negative for focal
airspace opacity or pleural effusion.

Normal heart size. No pericardial effusion. Although this
examination was not tailored for the evaluation the pulmonary
arteries, there are discrete filling defects within the central
pulmonary arterial tree to suggest central pulmonary embolism.

No acute or aggressive osseus abnormalities. Mild DDD of L2-L3 with
disc space height loss, endplate irregularity and sclerosis. Mild
degenerative change of the left SI joint without definite evidence
of sacroiliitis.

Note is made of small bilateral mesenteric fat containing inguinal
hernias.
IMPRESSION: 1. Rather diffuse colonic wall thickening extending from the distal
transverse to the proximal sigmoid colon with associated adjacent
mesenteric stranding, most conspicuous about the descending colon -
constellation of findings are most worrisome for an enteritis which
given history of Crohn's disease is favored to be inflammatory in
etiology. No evidence of perforation or definable/drainable fluid
collection. No definite evidence of fistula formation.
2. Shotty retroperitoneal, mesenteric and pelvic lymph nodes are
individually not enlarged by size criteria though numerous and
presumably reactive in etiology.
